# Patient Record
Sex: Male | Born: 1990 | Race: Black or African American | Hispanic: No | Smoking: Current every day smoker
Health system: Southern US, Community
[De-identification: ages and names within clinical notes are randomized; demographics above are authoritative.]

## PROBLEM LIST (undated history)

## (undated) DIAGNOSIS — E739 Lactose intolerance, unspecified: Secondary | ICD-10-CM

## (undated) HISTORY — DX: Lactose intolerance, unspecified: E73.9

---

## 2021-05-05 ENCOUNTER — Other Ambulatory Visit: Payer: Self-pay

## 2021-05-05 ENCOUNTER — Emergency Department
Admission: EM | Admit: 2021-05-05 | Discharge: 2021-05-05 | Disposition: A | Discharge status: Home / Self Care | Payer: Managed Care, Other (non HMO) | Attending: Emergency Medicine | Admitting: Emergency Medicine

## 2021-05-05 ENCOUNTER — Encounter: Payer: Self-pay | Admitting: Radiology

## 2021-05-05 ENCOUNTER — Emergency Department: Payer: Managed Care, Other (non HMO)

## 2021-05-05 DIAGNOSIS — R0789 Other chest pain: Secondary | ICD-10-CM | POA: Insufficient documentation

## 2021-05-05 DIAGNOSIS — F419 Anxiety disorder, unspecified: Secondary | ICD-10-CM | POA: Insufficient documentation

## 2021-05-05 DIAGNOSIS — F17200 Nicotine dependence, unspecified, uncomplicated: Secondary | ICD-10-CM | POA: Insufficient documentation

## 2021-05-05 LAB — CBC
HCT: 46.6 % (ref 39.0–52.0)
Hemoglobin: 16.1 g/dL (ref 13.0–17.0)
MCH: 29.6 pg (ref 26.0–34.0)
MCHC: 34.5 g/dL (ref 30.0–36.0)
MCV: 85.7 fL (ref 80.0–100.0)
Platelets: 284 10*3/uL (ref 150–400)
RBC: 5.44 MIL/uL (ref 4.22–5.81)
RDW: 12.1 % (ref 11.5–15.5)
WBC: 7 10*3/uL (ref 4.0–10.5)
nRBC: 0 % (ref 0.0–0.2)

## 2021-05-05 LAB — BASIC METABOLIC PANEL
Anion gap: 8 (ref 5–15)
BUN: 10 mg/dL (ref 6–20)
CO2: 25 mmol/L (ref 22–32)
Calcium: 9.9 mg/dL (ref 8.9–10.3)
Chloride: 106 mmol/L (ref 98–111)
Creatinine, Ser: 1.1 mg/dL (ref 0.61–1.24)
GFR, Estimated: >60 mL/min (ref >60)
Glucose, Bld: 108 mg/dL — ABNORMAL HIGH (ref 70–99)
Potassium: 3.5 mmol/L (ref 3.5–5.1)
Sodium: 139 mmol/L (ref 135–145)

## 2021-05-05 LAB — TROPONIN I (HIGH SENSITIVITY)
Troponin I (High Sensitivity): 3 ng/L (ref <18)
Troponin I (High Sensitivity): 2 ng/L (ref <18)

## 2021-05-05 NOTE — ED Triage Notes (Signed)
Pt with intermittent right sided chest pain since yesterday that he describes as stabbing. Pt denies shob, nausea, vomiting, dizziness. Pt appears in no acute distress.

## 2021-05-05 NOTE — ED Provider Notes (Signed)
[facility] Emergency Department Provider Note  ____________________________________________  Time seen: Approximately 4:32 AM  I have reviewed the triage vital signs and the nursing notes.   HISTORY  Chief Complaint Chest Pain   HPI Scott Guerrero is a 30 y.o. male who presents for evaluation of chest pain.  Patient has been having several episodes of chest pain since yesterday.  Describes the pain as a sharp pain that happens in different places in his chest, lasting seconds at a time.  No shortness of breath, no pain lasting more than a few seconds, no cough, no fever.  No personal history of PE or DVT, no recent travel immobilization, no leg pain or swelling, no hemoptysis or exogenous hormones.  Patient is here with his wife who is about to have a baby at any time.  The wife feels that he is very anxious and thinks this is anxiety related.  No abdominal pain, no nausea or vomiting, has been having normal bowel movements.  Patient is a smoker and described family history of lung cancer.  Patient was concerned that these pains could be due to lung cancer.   PMH None - reviewed  Allergies Patient has no known allergies.  FH Lung cancer  Social History  Smoking - yes Drugs - no   Review of Systems  Constitutional: Negative for fever. Eyes: Negative for visual changes. ENT: Negative for sore throat. Neck: No neck pain  Cardiovascular: + chest pain. Respiratory: Negative for shortness of breath. Gastrointestinal: Negative for abdominal pain, vomiting or diarrhea. Genitourinary: Negative for dysuria. Musculoskeletal: Negative for back pain. Skin: Negative for rash. Neurological: Negative for headaches, weakness or numbness. Psych: No SI or HI  ____________________________________________   PHYSICAL EXAM:  VITAL SIGNS: ED Triage Vitals  Enc Vitals Group     BP 05/05/21 0207 134/73     Pulse Rate 05/05/21 0207 76     Resp 05/05/21 0207  16     Temp 05/05/21 0207 98.1 F (36.7 C)     Temp Source 05/05/21 0207 Oral     SpO2 05/05/21 0207 100 %     Weight 05/05/21 0205 187 lb (84.8 kg)     Height 05/05/21 0205 5\' 11"  (1.803 m)     Head Circumference --      Peak Flow --      Pain Score 05/05/21 0205 3     Pain Loc --      Pain Edu? --      Excl. in GC? --     Constitutional: Alert and oriented. Well appearing and in no apparent distress. HEENT:      Head: Normocephalic and atraumatic.         Eyes: Conjunctivae are normal. Sclera is non-icteric.       Mouth/Throat: Mucous membranes are moist.       Neck: Supple with no signs of meningismus. Cardiovascular: Regular rate and rhythm. No murmurs, gallops, or rubs. 2+ symmetrical distal pulses are present in all extremities. No JVD. Respiratory: Normal respiratory effort. Lungs are clear to auscultation bilaterally.  Gastrointestinal: Soft, non tender. Musculoskeletal:  No edema, cyanosis, or erythema of extremities. Neurologic: Normal speech and language. Face is symmetric. Moving all extremities. No gross focal neurologic deficits are appreciated. Skin: Skin is warm, dry and intact. No rash noted. Psychiatric: Mood and affect are normal. Speech and behavior are normal.  ____________________________________________   LABS (all labs ordered are listed, but only abnormal results are displayed)  Labs  Reviewed  BASIC METABOLIC PANEL - Abnormal; Notable for the following components:      Result Value   Glucose, Bld 108 (*)    All other components within normal limits  CBC  TROPONIN I (HIGH SENSITIVITY)  TROPONIN I (HIGH SENSITIVITY)   ____________________________________________  EKG  ED ECG REPORT I, Nita Sickle, the attending physician, personally viewed and interpreted this ECG.  Sinus rhythm with a rate of 72, normal intervals, normal axis, T wave inversions in inferior leads with no ST elevations.  No prior for  comparison. ____________________________________________  RADIOLOGY  I have personally reviewed the images performed during this visit and I agree with the Radiologist's read.   Interpretation by Radiologist:  DG Chest 2 View  Result Date: 05/05/2021 CLINICAL DATA:  Chest pain EXAM: CHEST - 2 VIEW COMPARISON:  None. FINDINGS: The heart size and mediastinal contours are within normal limits. Both lungs are clear. The visualized skeletal structures are unremarkable. IMPRESSION: No active cardiopulmonary disease. Electronically Signed   By: Jasmine Pang M.D.   On: 05/05/2021 03:06     ____________________________________________   PROCEDURES  Procedure(s) performed:yes .1-3 Lead EKG Interpretation Performed by: Nita Sickle, MD Authorized by: Nita Sickle, MD     Interpretation: non-specific     ECG rate assessment: normal     Rhythm: sinus rhythm     Ectopy: none     Conduction: normal     Critical Care performed:  None ____________________________________________   INITIAL IMPRESSION / ASSESSMENT AND PLAN / ED COURSE   30 y.o. male who presents for evaluation of chest pain.  Patient with atypical intermittent chest pain since yesterday.  Describes the pain as sharp lasting a few seconds in different locations of his chest.  Heart score of 1 for smoking.  PERC negative.  EKG with no signs of acute ischemia.  2 high-sensitivity troponin is negative.  Abdomen soft and nontender.  Blood work unremarkable.  Chest x-ray with no signs of pneumothorax, pneumonia, edema, visualized by me confirmed by radiology.  Will recommend close follow-up with primary care doctor.  Recommended trying some Gas-X to see if the pain improves.  Discussed my return precautions for more severe pain or pain that lasts 30 minutes or more.      _____________________________________________ Please note:  Patient was evaluated in Emergency Department today for the symptoms described in the  history of present illness. Patient was evaluated in the context of the global COVID-19 pandemic, which necessitated consideration that the patient might be at risk for infection with the SARS-CoV-2 virus that causes COVID-19. Institutional protocols and algorithms that pertain to the evaluation of patients at risk for COVID-19 are in a state of rapid change based on information released by regulatory bodies including the CDC and federal and state organizations. These policies and algorithms were followed during the patient's care in the ED.  Some ED evaluations and interventions may be delayed as a result of limited staffing during the pandemic.   [place] Controlled Substance Database was reviewed by me. ____________________________________________   FINAL CLINICAL IMPRESSION(S) / ED DIAGNOSES   Final diagnoses:  Atypical chest pain      NEW MEDICATIONS STARTED DURING THIS VISIT:  ED Discharge Orders    None       Note:  This document was prepared using Dragon voice recognition software and may include unintentional dictation errors.    Don Perking, [place], MD 05/05/21 [phone]

## 2021-05-05 NOTE — Discharge Instructions (Addendum)

## 2021-07-25 ENCOUNTER — Ambulatory Visit: Payer: Managed Care, Other (non HMO) | Admitting: Nurse Practitioner

## 2021-07-25 VITALS — BP 122/68 | HR 84 | Temp 98.4°F | Resp 16 | Ht 72.0 in | Wt 187.0 lb

## 2021-07-25 DIAGNOSIS — Z0000 Encounter for general adult medical examination without abnormal findings: Secondary | ICD-10-CM | POA: Diagnosis not present

## 2021-07-25 DIAGNOSIS — Z008 Encounter for other general examination: Secondary | ICD-10-CM

## 2021-07-25 DIAGNOSIS — E739 Lactose intolerance, unspecified: Secondary | ICD-10-CM

## 2021-07-25 NOTE — Patient Instructions (Signed)
Scott Guerrero, it was a pleasure to meet you today. Thank you for allowing me to be a part of your health care team today. I have given you information today on healthy living and immunizations. 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 Please take time to take care of you. I wish you all the best with your newborn.

## 2021-07-25 NOTE — Progress Notes (Signed)
Subjective:    Patient ID: Scott Guerrero, male    DOB: 03/30/1991, 30 y.o.   MRN: [id number]  HPI Scott Guerrero is a 30 y.o. male who presents to the [facility] for his annual biometric screening exam. 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    Past Medical History:  Diagnosis Date   Lactose intolerance    History reviewed. No pertinent surgical history. Current Outpatient Medications on File Prior to Visit  Medication Sig Dispense Refill   ELDERBERRY PO Take by mouth.     No current facility-administered medications on file prior to visit.   Allergies  Allergen Reactions   Phenergan [Promethazine Hcl] Other (See Comments)    Causing shaking   Social hx: lives with wife and infant. 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 Moved to [place] from [facility] a little over a year ago. Denies use of ETOH or drugs, he does vape  Diet/Exercise: tries to eat healthy and works out as often as possible.   Review of Systems  HENT:         Recently had a tooth extracted.  Gastrointestinal:        Occasional issues with constipation.  All other systems reviewed and are negative.     Objective:   Physical Exam Vitals and nursing note reviewed.  Constitutional:      Appearance: Normal appearance. He is well-groomed.  HENT:     Head: Normocephalic.     Jaw: There is normal jaw occlusion.     Right Ear: Tympanic membrane and external ear normal.     Left Ear: Tympanic membrane, ear canal and external ear normal.     Nose: Nose normal.     Mouth/Throat:     Lips: Pink.     Mouth: Mucous membranes are moist.     Pharynx: Oropharynx is clear.  Eyes:     General: Lids are normal.     Extraocular Movements: Extraocular movements intact.     Conjunctiva/sclera: Conjunctivae normal.     Funduscopic exam:    Right eye: Red reflex present.        Left eye: Red reflex  present. Neck:     Thyroid: No thyroid mass.     Vascular: No JVD.     Trachea: Trachea normal.  Cardiovascular:     Rate and Rhythm: Normal rate and regular rhythm.     Heart sounds: No murmur heard. Pulmonary:     Effort: Pulmonary effort is normal.     Breath sounds: Normal breath sounds and air entry.  Abdominal:     General: Bowel sounds are normal.     Palpations: Abdomen is soft.     Tenderness: There is no abdominal tenderness. There is no right CVA tenderness or left CVA tenderness.  Musculoskeletal:        General: No swelling. Normal range of motion.     Cervical back: Normal range of motion. No spinous process tenderness or muscular tenderness.     Right lower leg: No edema.     Left lower leg: No edema.  Skin:    General: Skin is warm and dry.  Neurological:     General: No focal deficit present.     Mental Status: He is alert.     Motor: No weakness or pronator drift.     Coordination: Romberg sign negative. Coordination  normal. Finger-Nose-Finger Test and Heel to [facility] Test normal.     Gait: Gait normal.     Deep Tendon Reflexes:     Reflex Scores:      Bicep reflexes are 2+ on the right side and 2+ on the left side.      Brachioradialis reflexes are 2+ on the right side and 2+ on the left side.      Patellar reflexes are 2+ on the right side and 2+ on the left side.    Comments: Stands on one foot without difficulty.  Psychiatric:        Mood and Affect: Mood normal.     Assessment & Plan:  1. Encounter for biometric screening - Glucose, random - Lipid panel  2. Encounter for other general examination  3. Encounter for preventive health examination  4. Lactose intolerance  Discussed with the patient clinical findings and plan of care. Patient given the opportunity to ask questions. All questioned fully answered and patient voices understanding. He will plan to RTC in one year or sooner if needed.

## 2021-07-26 LAB — LIPID PANEL
Chol/HDL Ratio: 4 ratio (ref 0.0–5.0)
Cholesterol, Total: 169 mg/dL (ref 100–199)
HDL: 42 mg/dL (ref >39)
LDL Chol Calc (NIH): 110 mg/dL — ABNORMAL HIGH (ref 0–99)
Triglycerides: 92 mg/dL (ref 0–149)
VLDL Cholesterol Cal: 17 mg/dL (ref 5–40)

## 2021-07-26 LAB — GLUCOSE, RANDOM: Glucose: 97 mg/dL (ref 65–99)

## 2022-11-19 IMAGING — CR DG CHEST 2V
2 series · 2 of 2 positions shown · non-contrast
Comparison: None.

CLINICAL DATA: Chest pain

EXAM:
CHEST - 2 VIEW

[chest pa]
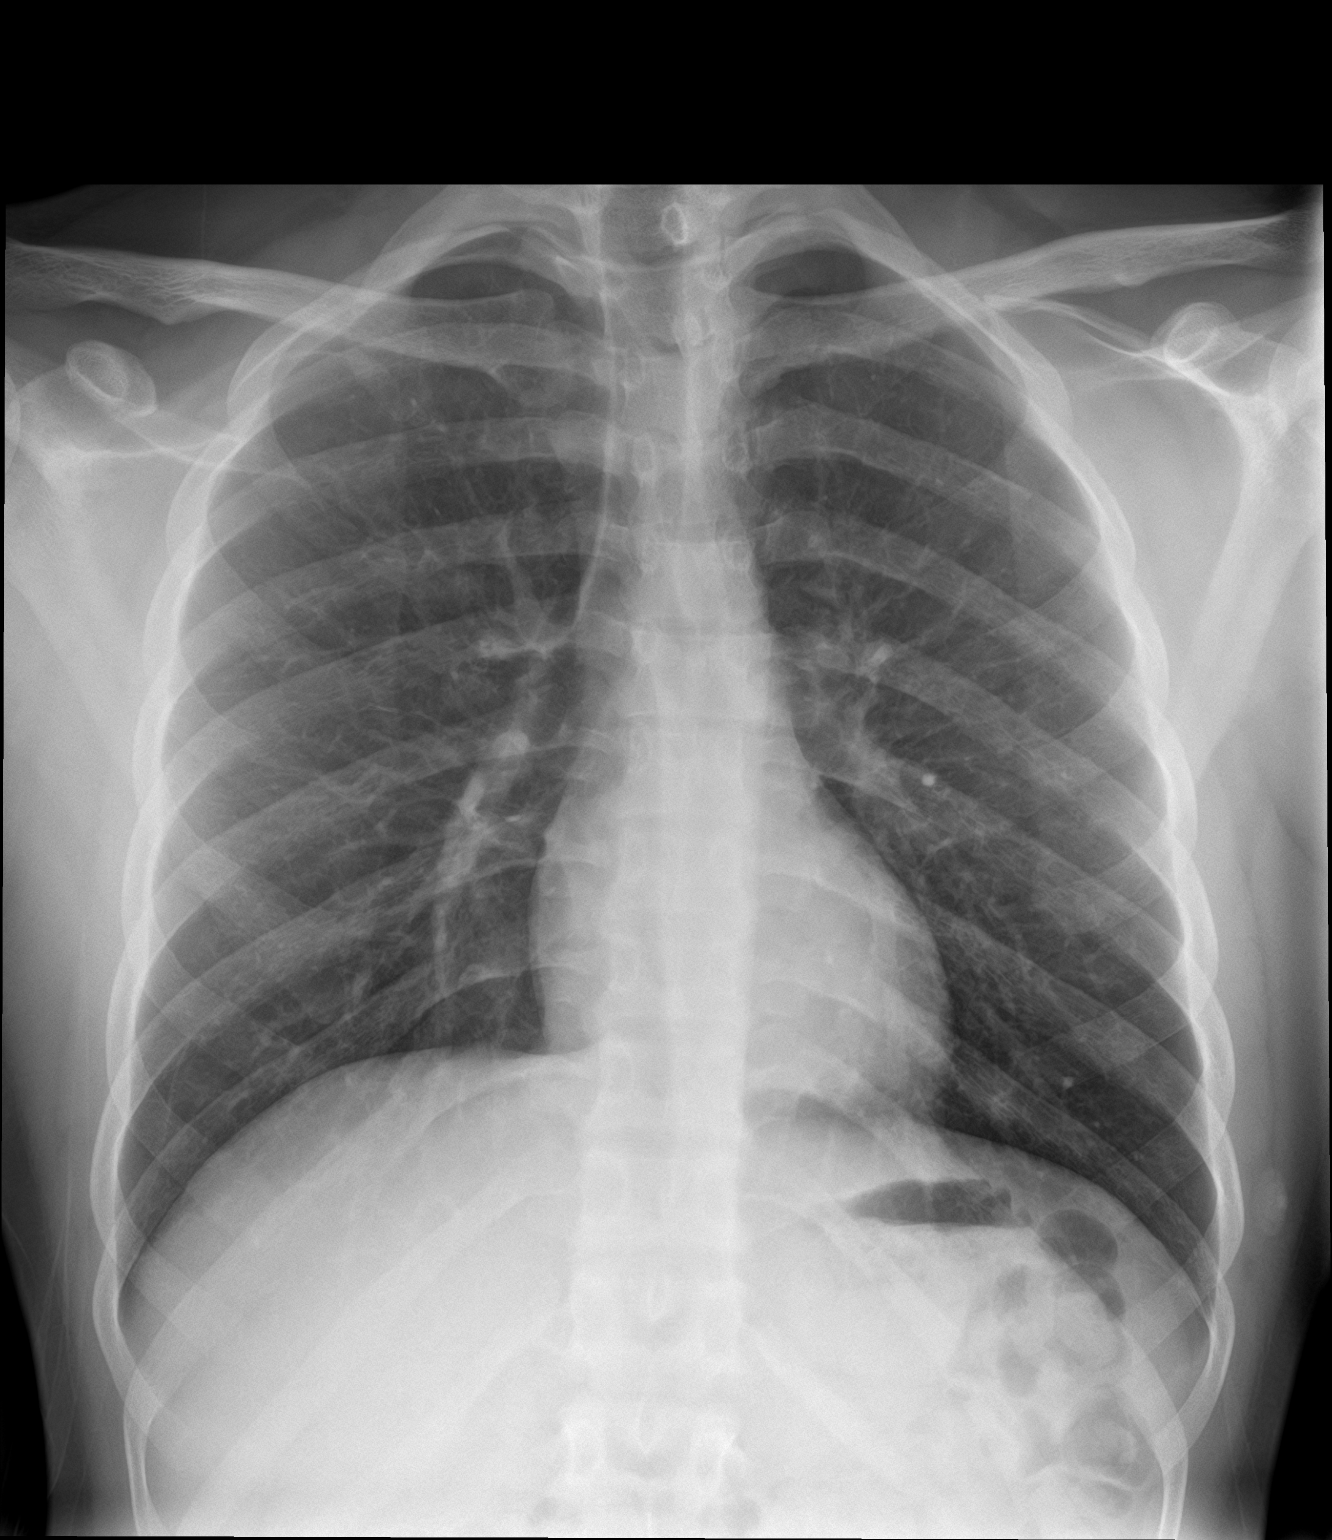

[chest lat]
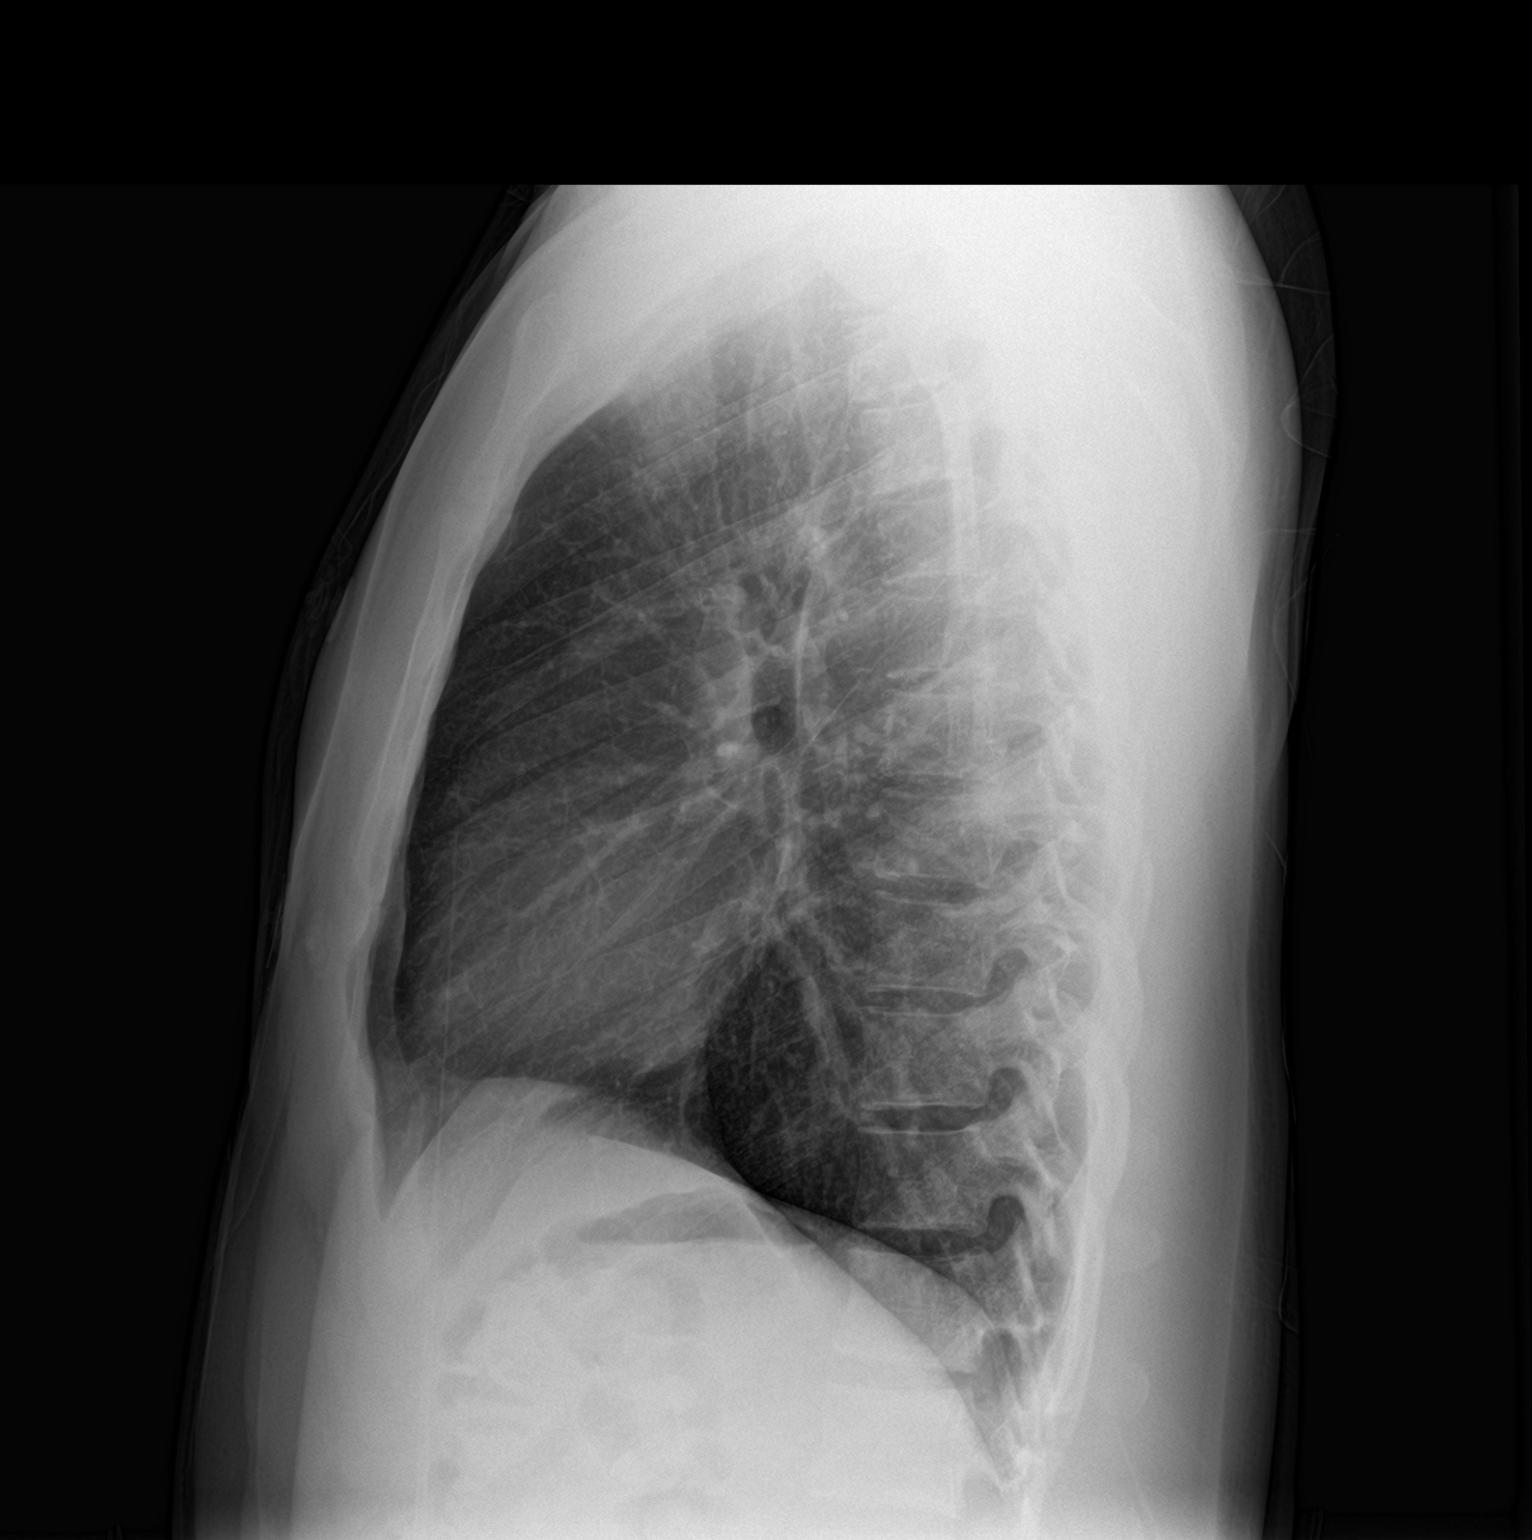

[2 of 2 positions shown; findings below may reference images not displayed]

FINDINGS: The heart size and mediastinal contours are within normal limits.
Both lungs are clear. The visualized skeletal structures are
unremarkable.
IMPRESSION: No active cardiopulmonary disease.

## 2024-09-22 ENCOUNTER — Ambulatory Visit: Admission: EM | Admit: 2024-09-22 | Discharge: 2024-09-22 | Disposition: A | Discharge status: Home / Self Care

## 2024-09-22 DIAGNOSIS — S91331A Puncture wound without foreign body, right foot, initial encounter: Secondary | ICD-10-CM | POA: Diagnosis not present

## 2024-09-22 MED ORDER — AMOXICILLIN-POT CLAVULANATE 875-125 MG PO TABS: 1.0000 | ORAL_TABLET | Freq: Two times a day (BID) | ORAL | 0 refills | Status: AC

## 2024-09-22 MED ORDER — MUPIROCIN 2 % EX OINT: 1.0000 | TOPICAL_OINTMENT | Freq: Two times a day (BID) | CUTANEOUS | 0 refills | Status: AC

## 2024-09-22 NOTE — ED Provider Notes (Signed)
 [facility]    CSN: [id number] Arrival date & time: 09/22/24  9182      History   Chief Complaint Chief Complaint  Patient presents with   Foot Injury    HPI Scott Guerrero is a 33 y.o. male presenting for puncture wound between right great toe and second to since yesterday. He says he was rushing upstairs to his child and kicked a drill.  He reports using alcohol, nystatin, another over-the-counter antifungal, which hazel, hydroperoxide and antibacterial cream on the wound and then going to the emergency department.  He waited in the ER for 6 hours.  They obtained an x-ray of his foot but he never learned the results.  After waiting for a long period of time he decided to go home.  Of note he has been on Augmentin and has a day left of this.  He was put on this medication for an ear infection.  Tetanus immunization up-to-date as of 2021.  HPI  Past Medical History:  Diagnosis Date   Lactose intolerance     Patient Active Problem List   Diagnosis Date Noted   Lactose intolerance 07/25/2021    History reviewed. No pertinent surgical history.     Home Medications    Prior to Admission medications   Medication Sig Start Date End Date Taking? Authorizing Provider  amoxicillin-clavulanate (AUGMENTIN) 875-125 MG tablet Take 1 tablet by mouth every 12 (twelve) hours for 7 days. 09/22/24 09/29/24 Yes Arvis Huxley B, PA-C  ELDERBERRY PO Take by mouth.   Yes [provider]  mupirocin ointment (BACTROBAN) 2 % Apply 1 Application topically 2 (two) times daily. 09/22/24  Yes Arvis Huxley B, PA-C  ofloxacin (FLOXIN) 0.3 % OTIC solution Place 10 drops in ear(s). 09/16/24 09/23/24 Yes [provider]    Family History History reviewed. No pertinent family history.  Social History Social History   Tobacco Use   Smoking status: Every Day    Types: E-cigarettes   Smokeless tobacco: Current  Vaping Use   Vaping status: Every Day  Substance Use  Topics   Alcohol use: Yes   Drug use: Never     Allergies   Phenergan [promethazine hcl]   Review of Systems Review of Systems  Musculoskeletal:  Negative for arthralgias and joint swelling.  Skin:  Positive for wound. Negative for color change.  Neurological:  Negative for weakness and numbness.     Physical Exam Triage Vital Signs ED Triage Vitals  Encounter Vitals Group     BP      Girls Systolic BP Percentile      Girls Diastolic BP Percentile      Boys Systolic BP Percentile      Boys Diastolic BP Percentile      Pulse      Resp      Temp      Temp src      SpO2      Weight      Height      Head Circumference      Peak Flow      Pain Score      Pain Loc      Pain Education      Exclude from Growth Chart    No data found.  Updated Vital Signs BP 122/71 (BP Location: Left Arm)   Pulse 63   Temp 97.8 F (36.6 C) (Oral)   Resp 12   Wt 189 lb 3.2 oz (85.8 kg)   SpO2  100%   BMI 25.66 kg/m    Physical Exam Vitals and nursing note reviewed.  Constitutional:      General: He is not in acute distress.    Appearance: Normal appearance. He is well-developed. He is not ill-appearing.  HENT:     Head: Normocephalic and atraumatic.  Eyes:     General: No scleral icterus.    Conjunctiva/sclera: Conjunctivae normal.  Cardiovascular:     Rate and Rhythm: Normal rate.     Pulses: Normal pulses.  Pulmonary:     Effort: Pulmonary effort is normal. No respiratory distress.  Musculoskeletal:     Cervical back: Neck supple.  Skin:    General: Skin is warm and dry.     Capillary Refill: Capillary refill takes less than 2 seconds.  Neurological:     General: No focal deficit present.     Mental Status: He is alert. Mental status is at baseline.     Motor: No weakness.     Gait: Gait normal.  Psychiatric:        Mood and Affect: Mood normal.        Behavior: Behavior normal.    Right foot: 1 cm puncture/laceration between great toe and second toe.   Bleeding controlled.  No bony tenderness.  No drainage.  Full range of motion of toes.  Good pulses.  UC Treatments / Results  Labs (all labs ordered are listed, but only abnormal results are displayed) Labs Reviewed - No data to display  EKG   Radiology No results found. Imaging Results - XR Foot 3 Or More Views Right (09/21/2024 10:21 PM EDT) Procedure Note  Defreitas, Orlene SAUNDERS, MD - 09/21/2024  Formatting of this note might be different from the original. EXAM: XR FOOT 3 OR MORE VIEWS RIGHT DATE: 09/21/2024 10:21 PM ACCESSION: [id number] UN DICTATED: 09/21/2024 10:37 PM INTERPRETATION LOCATION: [facility]  CLINICAL INDICATION: 33 years old Male with Trauma/Injury  COMPARISON: None.  TECHNIQUE: Dorsoplantar, oblique and lateral views of the right foot.  FINDINGS: No fracture or dislocation. Joint spaces are maintained. No radiopaque foreign body.  IMPRESSION: No fracture, dislocation, or radiopaque foreign body.   Procedures Procedures (including critical care time)  Medications Ordered in UC Medications - No data to display  Initial Impression / Assessment and Plan / UC Course  I have reviewed the triage vital signs and the nursing notes.  Pertinent labs & imaging results that were available during my care of the patient were reviewed by me and considered in my medical decision making (see chart for details).   33 year old male presents for laceration/puncture wound by drill between great toe and second toe of the right foot.  Injury occurred 12 to 13 hours ago.  He went to the ED and had imaging which was negative.  Included above.  He has cleaned the wound.  Tetanus immunization up-to-date.  Currently on Augmentin for 1 more day.  See image included in chart.  Advised patient to continue cleaning the wound with soap and water.  Sent mupirocin ointment and advised not to put anything else on the wound.  Advised to cover during the day and leave open to air at  night.  Sent another prescription for Augmentin for him to take over the next week for prophylaxis.  Vies him to return if pustular drainage, redness, swelling or increased pain.  Work note given for today.   Final Clinical Impressions(s) / UC Diagnoses   Final diagnoses:  Puncture wound of right foot,  initial encounter     Discharge Instructions      -X-ray was normal in ED -Clean area with soap and water or antibacterial spray 2x daily -Use ointment. Start Augmentin -Return if pustular drainage, redness, swelling     ED Prescriptions     Medication Sig Dispense Auth. Provider   amoxicillin-clavulanate (AUGMENTIN) 875-125 MG tablet Take 1 tablet by mouth every 12 (twelve) hours for 7 days. 14 tablet Arvis Huxley B, PA-C   mupirocin ointment (BACTROBAN) 2 % Apply 1 Application topically 2 (two) times daily. 22 g Arvis Huxley NOVAK, PA-C      PDMP not reviewed this encounter.   Arvis Huxley NOVAK, PA-C 09/22/24 [phone]

## 2024-09-22 NOTE — ED Triage Notes (Signed)
 Pt c/o injury to right foot  Pt states that he was going up the stairs and kicked a power drill  Pt states that the drill bit stabbed into his foot   Pt last tetanus was on 06/14/2020  Pt has a puncture wound in between the first and 2nd toe on the right foot.  Pt has used Alcohol, nystatin, witch hazel, hydrogen peroxide, antifungal, antibacterial cream on his injury  Pt is taking amoxicillin for a ear infection

## 2024-09-22 NOTE — Discharge Instructions (Addendum)
-  X-ray was normal in ED -Clean area with soap and water or antibacterial spray 2x daily -Use ointment. Start Augmentin -Return if pustular drainage, redness, swelling
